# Patient Record
Sex: Male | Born: 1970 | Race: White | Hispanic: No | Marital: Single
Health system: Southern US, Community
[De-identification: ages and names within clinical notes are randomized; demographics above are authoritative.]

## PROBLEM LIST (undated history)

## (undated) DIAGNOSIS — K7290 Hepatic failure, unspecified without coma: Secondary | ICD-10-CM

---

## 2020-08-12 ENCOUNTER — Other Ambulatory Visit: Payer: Self-pay

## 2020-08-12 ENCOUNTER — Emergency Department (HOSPITAL_COMMUNITY)
Admission: EM | Admit: 2020-08-12 | Discharge: 2020-08-12 | Disposition: A | Discharge status: Home / Self Care | Payer: BC Managed Care – PPO | Attending: Emergency Medicine | Admitting: Emergency Medicine

## 2020-08-12 ENCOUNTER — Emergency Department (HOSPITAL_COMMUNITY): Payer: BC Managed Care – PPO

## 2020-08-12 DIAGNOSIS — R0602 Shortness of breath: Secondary | ICD-10-CM | POA: Insufficient documentation

## 2020-08-12 DIAGNOSIS — D649 Anemia, unspecified: Secondary | ICD-10-CM | POA: Insufficient documentation

## 2020-08-12 DIAGNOSIS — Z9889 Other specified postprocedural states: Secondary | ICD-10-CM

## 2020-08-12 DIAGNOSIS — Z20822 Contact with and (suspected) exposure to covid-19: Secondary | ICD-10-CM | POA: Insufficient documentation

## 2020-08-12 DIAGNOSIS — K7031 Alcoholic cirrhosis of liver with ascites: Secondary | ICD-10-CM

## 2020-08-12 DIAGNOSIS — R1900 Intra-abdominal and pelvic swelling, mass and lump, unspecified site: Secondary | ICD-10-CM | POA: Insufficient documentation

## 2020-08-12 DIAGNOSIS — R7401 Elevation of levels of liver transaminase levels: Secondary | ICD-10-CM | POA: Insufficient documentation

## 2020-08-12 HISTORY — PX: IR PARACENTESIS: IMG2679

## 2020-08-12 LAB — CBC WITH DIFFERENTIAL/PLATELET
Abs Immature Granulocytes: 0.01 10*3/uL (ref 0.00–0.07)
Basophils Absolute: 0 10*3/uL (ref 0.0–0.1)
Basophils Relative: 1 %
Eosinophils Absolute: 0.1 10*3/uL (ref 0.0–0.5)
Eosinophils Relative: 2 %
HCT: 35.3 % — ABNORMAL LOW (ref 39.0–52.0)
Hemoglobin: 11.7 g/dL — ABNORMAL LOW (ref 13.0–17.0)
Immature Granulocytes: 0 %
Lymphocytes Relative: 10 %
Lymphs Abs: 0.6 10*3/uL — ABNORMAL LOW (ref 0.7–4.0)
MCH: 39.7 pg — ABNORMAL HIGH (ref 26.0–34.0)
MCHC: 33.1 g/dL (ref 30.0–36.0)
MCV: 119.7 fL — ABNORMAL HIGH (ref 80.0–100.0)
Monocytes Absolute: 0.4 10*3/uL (ref 0.1–1.0)
Monocytes Relative: 6 %
Neutro Abs: 5.4 10*3/uL (ref 1.7–7.7)
Neutrophils Relative %: 81 %
Platelets: 122 10*3/uL — ABNORMAL LOW (ref 150–400)
RBC: 2.95 MIL/uL — ABNORMAL LOW (ref 4.22–5.81)
RDW: 14.6 % (ref 11.5–15.5)
WBC: 6.6 10*3/uL (ref 4.0–10.5)
nRBC: 0 % (ref 0.0–0.2)

## 2020-08-12 LAB — URINALYSIS, ROUTINE W REFLEX MICROSCOPIC
Bacteria, UA: NONE SEEN
Bilirubin Urine: NEGATIVE
Glucose, UA: NEGATIVE mg/dL
Ketones, ur: NEGATIVE mg/dL
Leukocytes,Ua: NEGATIVE
Nitrite: NEGATIVE
Protein, ur: NEGATIVE mg/dL
Specific Gravity, Urine: 1.011 (ref 1.005–1.030)
pH: 6 (ref 5.0–8.0)

## 2020-08-12 LAB — COMPREHENSIVE METABOLIC PANEL
ALT: 114 U/L — ABNORMAL HIGH (ref 0–44)
AST: 103 U/L — ABNORMAL HIGH (ref 15–41)
Albumin: 2.5 g/dL — ABNORMAL LOW (ref 3.5–5.0)
Alkaline Phosphatase: 166 U/L — ABNORMAL HIGH (ref 38–126)
Anion gap: 7 (ref 5–15)
BUN: 11 mg/dL (ref 6–20)
CO2: 23 mmol/L (ref 22–32)
Calcium: 8.8 mg/dL — ABNORMAL LOW (ref 8.9–10.3)
Chloride: 107 mmol/L (ref 98–111)
Creatinine, Ser: 1.04 mg/dL (ref 0.61–1.24)
GFR, Estimated: >60 mL/min (ref >60)
Glucose, Bld: 92 mg/dL (ref 70–99)
Potassium: 3.5 mmol/L (ref 3.5–5.1)
Sodium: 137 mmol/L (ref 135–145)
Total Bilirubin: 6.9 mg/dL — ABNORMAL HIGH (ref 0.3–1.2)
Total Protein: 5.4 g/dL — ABNORMAL LOW (ref 6.5–8.1)

## 2020-08-12 LAB — RESP PANEL BY RT-PCR (FLU A&B, COVID) ARPGX2
Influenza A by PCR: NEGATIVE
Influenza B by PCR: NEGATIVE
SARS Coronavirus 2 by RT PCR: NEGATIVE

## 2020-08-12 LAB — LIPASE, BLOOD: Lipase: 41 U/L (ref 11–51)

## 2020-08-12 MED ORDER — LIDOCAINE HCL 1 % IJ SOLN
INTRAMUSCULAR | Status: AC
  Filled 2020-08-12: qty 20

## 2020-08-12 NOTE — ED Notes (Signed)
Pt to IR for paracentesis. 

## 2020-08-12 NOTE — Discharge Instructions (Addendum)
Contact a health care provider if: You have redness, swelling, or pain at your puncture site. You have more fluid or blood coming from your puncture site. Your puncture site feels warm to the touch. You have pus or a bad smell coming from your puncture site. You have a fever. Get help right away if: You have chest pain or shortness of breath. You develop increasing pain, discomfort, or swelling in your abdomen. You feel dizzy or light-headed, or you faint.

## 2020-08-12 NOTE — ED Provider Notes (Signed)
[facility] EMERGENCY DEPARTMENT Provider Note   CSN: [id number] Arrival date & time: 08/12/20  1122     History No chief complaint on file.   Justin Rogers is a 50 y.o. male who presents with abdominal swelling. He has a PMH of alcoholic cirrhosis. He is currently a patient at [facility] x 1 week.  He reports he has had worsening abdominal distention, fluid in his lower extremities and shortness of breath due to his abdominal distention.  He has needed a paracentesis in the past and states that he had when he thinks back in February at [place].  He denies fevers or chills.  He denies abdominal tenderness.  He states that the attending physician, Dr. Lysbeth Penner, at [facility] tried several times to obtain an outpatient order for paracentesis but was unable to obtain the procedure.   HPI     No past medical history on file.  There are no problems to display for this patient.        No family history on file.     Home Medications Prior to Admission medications   Not on File    Allergies    Patient has no allergy information on record.  Review of Systems   Review of Systems Ten systems reviewed and are negative for acute change, except as noted in the HPI.   Physical Exam Updated Vital Signs BP (!) 133/101   Pulse 79   Temp 98.3 F (36.8 C) (Oral)   Resp (!) 22   Ht 5\' 10"  (1.778 m)   Wt 101.8 kg   SpO2 100%   BMI 32.20 kg/m   Physical Exam Vitals and nursing note reviewed.  Constitutional:      General: He is not in acute distress.    Appearance: He is well-developed. He is not diaphoretic.  HENT:     Head: Normocephalic and atraumatic.  Eyes:     General: Scleral icterus present.     Conjunctiva/sclera: Conjunctivae normal.  Cardiovascular:     Rate and Rhythm: Normal rate and regular rhythm.     Heart sounds: Normal heart sounds.  Pulmonary:     Effort: Tachypnea present. No respiratory distress.     Breath sounds: Normal  breath sounds.  Abdominal:     General: There is distension.     Palpations: Abdomen is rigid. There is fluid wave.     Tenderness: There is no abdominal tenderness.  Musculoskeletal:     Cervical back: Normal range of motion and neck supple.  Skin:    General: Skin is warm and dry.  Neurological:     Mental Status: He is alert.  Psychiatric:        Behavior: Behavior normal.     ED Results / Procedures / Treatments   Labs (all labs ordered are listed, but only abnormal results are displayed) Labs Reviewed  CBC WITH DIFFERENTIAL/PLATELET - Abnormal; Notable for the following components:      Result Value   RBC 2.95 (*)    Hemoglobin 11.7 (*)    HCT 35.3 (*)    MCV 119.7 (*)    MCH 39.7 (*)    Platelets 122 (*)    Lymphs Abs 0.6 (*)    All other components within normal limits  COMPREHENSIVE METABOLIC PANEL - Abnormal; Notable for the following components:   Calcium 8.8 (*)    Total Protein 5.4 (*)    Albumin 2.5 (*)    AST 103 (*)  ALT 114 (*)    Alkaline Phosphatase 166 (*)    Total Bilirubin 6.9 (*)    All other components within normal limits  URINALYSIS, ROUTINE W REFLEX MICROSCOPIC - Abnormal; Notable for the following components:   Color, Urine AMBER (*)    Hgb urine dipstick SMALL (*)    All other components within normal limits  RESP PANEL BY RT-PCR (FLU A&B, COVID) [id number]  LIPASE, BLOOD    EKG EKG Interpretation  Date/Time:  Monday August 12 2020 11:22:58 EDT Ventricular Rate:  76 PR Interval:    QRS Duration: 102 QT Interval:  417 QTC Calculation: 469 R Axis:   68 Text Interpretation: Sinus rhythm Low voltage, precordial leads Confirmed by Virgina Norfolk [phone]) on 08/12/2020 11:37:06 AM   Radiology DG Chest Portable 1 View  Result Date: 08/12/2020 CLINICAL DATA:  Shortness of breath EXAM: PORTABLE CHEST 1 VIEW COMPARISON:  None. FINDINGS: There is mild atelectasis in the left base. The lungs elsewhere are clear. Heart is upper normal in size  with pulmonary vascularity normal. No adenopathy. No bone lesions. There is degenerative change in each shoulder. There are surgical clips in the medial left supraclavicular region. IMPRESSION: Left base atelectasis. Lungs otherwise clear. Heart upper normal in size. Electronically Signed   By: Bretta Bang III M.D.   On: 08/12/2020 11:56    Procedures Procedures ]  Medications Ordered in ED Medications - No data to display  ED Course  I have reviewed the triage vital signs and the nursing notes.  Pertinent labs & imaging results that were available during my care of the patient were reviewed by me and considered in my medical decision making (see chart for details).  Clinical Course as of 08/14/20 1239  Mon Aug 12, 2020  1348 Albumin(!): 2.5 [AH]    Clinical Course User Index [AH] Arthor Captain, PA-C   MDM Rules/Calculators/A&P                          Patient here with ascites Labs ordered/ reviewed - cbc with macrocytic anemia, CMP with elevated liver enzymes, bilirubin of almost 7, albumin of 2.5 are consistent with history of alcoholic cirrhosis.  Covid panel is negative.  UA without evidence of infection. I discussed the case with Dr. Bryn Gulling of IR patient will have paracentesis today.  Patient has returned.  4 L removed from the abdomen.  Patient feels significantly improved.  I gave report to nursing staff at [facility] and patient will be picked up by their facility.  He appears appropriate for discharge at this time. Final Clinical Impression(s) / ED Diagnoses Final diagnoses:  Abdominal swelling    Rx / DC Orders ED Discharge Orders    None       Arthor Captain, PA-C 08/14/20 1243    Virgina Norfolk, DO 08/15/20 [phone]

## 2020-08-12 NOTE — ED Triage Notes (Signed)
PT BIB [facility] from [facility] for abd pain and swelling r/t ETOH and liver failure.  Pt had almost 2L pulled off in paracentesis at [facility] in February.  Also complaining of some swelling to feet and ankles.

## 2020-08-14 NOTE — Procedures (Signed)
PROCEDURE SUMMARY:  Successful US guided paracentesis from left lateral abdomen.  Yielded 4.0 liters of yellow fluid.  No immediate complications.  Pt tolerated well.   Specimen was not sent for labs.  EBL < 46mL  Hoyt Koch PA-C 08/14/2020 9:31 AM

## 2020-08-20 ENCOUNTER — Emergency Department (HOSPITAL_COMMUNITY)
Admission: EM | Admit: 2020-08-20 | Discharge: 2020-08-20 | Disposition: A | Discharge status: Home / Self Care | Payer: BC Managed Care – PPO | Attending: Emergency Medicine | Admitting: Emergency Medicine

## 2020-08-20 ENCOUNTER — Encounter (HOSPITAL_COMMUNITY): Payer: Self-pay | Admitting: Emergency Medicine

## 2020-08-20 ENCOUNTER — Emergency Department (HOSPITAL_COMMUNITY): Payer: BC Managed Care – PPO

## 2020-08-20 ENCOUNTER — Emergency Department (HOSPITAL_BASED_OUTPATIENT_CLINIC_OR_DEPARTMENT_OTHER): Payer: BC Managed Care – PPO

## 2020-08-20 DIAGNOSIS — L03116 Cellulitis of left lower limb: Secondary | ICD-10-CM | POA: Diagnosis not present

## 2020-08-20 DIAGNOSIS — R609 Edema, unspecified: Secondary | ICD-10-CM

## 2020-08-20 DIAGNOSIS — M25572 Pain in left ankle and joints of left foot: Secondary | ICD-10-CM | POA: Diagnosis present

## 2020-08-20 HISTORY — DX: Hepatic failure, unspecified without coma: K72.90

## 2020-08-20 MED ORDER — CEPHALEXIN 500 MG PO CAPS: 500.0000 mg | ORAL_CAPSULE | Freq: Four times a day (QID) | ORAL | 0 refills | Status: AC

## 2020-08-20 MED ORDER — CEPHALEXIN 500 MG PO CAPS: 500.0000 mg | ORAL_CAPSULE | Freq: Four times a day (QID) | ORAL | 0 refills | Status: DC

## 2020-08-20 MED ORDER — OXYCODONE-ACETAMINOPHEN 5-325 MG PO TABS
1.0000 | ORAL_TABLET | Freq: Once | ORAL | Status: DC
  Filled 2020-08-20: qty 1

## 2020-08-20 NOTE — Discharge Instructions (Signed)
You came to the emerge department to be evaluated for your left leg swelling.  Your x-ray showed no broken bones or dislocations.  Your ultrasound showed no DVT.  Based on your physical exam there is a concern that you have a cellulitis skin infection.  Because of this I have given you prescription for the antibiotic Keflex.  Please take 1 pill 4 times a day for the next 7 days.  Please elevate your leg as much as possible to help decrease swelling.  Please use ice 20 minutes, then give break of 20 minutes before reapplying to help decrease swelling.  If your symptoms do not improve over the next 7 days please follow-up with your primary care provider.  Get help right away if: Your symptoms get worse. You feel very sleepy. You develop vomiting or diarrhea that persists. You notice red streaks coming from the infected area. Your red area gets larger or turns dark in color.

## 2020-08-20 NOTE — ED Triage Notes (Signed)
Per EMS-from [facility]-complaining of LLE swelling

## 2020-08-20 NOTE — ED Provider Notes (Signed)
[facility]-EMERGENCY DEPT Provider Note   CSN: [id number] Arrival date & time: 08/20/20  1216     History Chief Complaint  Patient presents with  . Leg Pain    Justin Rogers is a 50 y.o. male with history of liver failure.  Patient presents to the emergency department with chief complaint of left ankle pain and swelling.  Patient denies any recent injuries or trauma.  Patient reports that his pain and swelling began this morning approximately 0300.  Pain is located to medial and lateral malleolus.  Reports pain is constant.  Patient rates pain 10/10 on the pain scale.  Denies any radiation of pain.  Pain is improved with rest and worse with movement.  Patient endorses erythema to left ankle.  Patient denies any previous history of similar symptoms.  Patient denies any fever, chills, numbness or tingling to extremities, weakness of extremities, wounds, compromised immune system, chest pain, shortness of breath.  HPI     Past Medical History:  Diagnosis Date  . Liver failure (HCC)     There are no problems to display for this patient.   Past Surgical History:  Procedure Laterality Date  . IR PARACENTESIS  08/12/2020       No family history on file.  Social History   Substance Use Topics  . Alcohol use: Yes    Home Medications Prior to Admission medications   Not on File    Allergies    Patient has no allergy information on record.  Review of Systems   Review of Systems  Constitutional: Negative for chills and fever.  Respiratory: Negative for shortness of breath.   Cardiovascular: Positive for leg swelling. Negative for chest pain.  Musculoskeletal: Positive for arthralgias and joint swelling. Negative for back pain and neck pain.  Skin: Positive for color change. Negative for pallor, rash and wound.  Allergic/Immunologic: Negative for immunocompromised state.  Neurological: Negative for weakness and numbness.    Physical  Exam Updated Vital Signs BP (!) 143/113 (BP Location: Right Arm)   Pulse 90   Temp 98.7 F (37.1 C) (Oral)   Resp 16   SpO2 100%   Physical Exam Vitals and nursing note reviewed.  Constitutional:      General: He is not in acute distress.    Appearance: He is not ill-appearing, toxic-appearing or diaphoretic.  HENT:     Head: Normocephalic.  Eyes:     General: No scleral icterus.       Right eye: No discharge.        Left eye: No discharge.  Cardiovascular:     Rate and Rhythm: Normal rate.  Pulmonary:     Effort: Pulmonary effort is normal.  Musculoskeletal:     Right upper leg: Normal.     Left upper leg: Normal.     Right knee: No swelling, deformity, effusion, erythema, ecchymosis, lacerations or bony tenderness. Normal range of motion. No tenderness. Normal alignment.     Left knee: No swelling, deformity, effusion, erythema, ecchymosis, lacerations or bony tenderness. Normal range of motion. No tenderness. Normal alignment.     Right lower leg: Normal.     Left lower leg: No deformity, lacerations, tenderness or bony tenderness. 1+ Edema present.     Right ankle: No swelling, deformity, ecchymosis or lacerations. No tenderness. Normal range of motion.     Left ankle: Swelling present. No deformity, ecchymosis or lacerations. Tenderness present over the lateral malleolus and medial malleolus. Normal range of motion.  Left foot: Normal range of motion and normal capillary refill. No swelling, deformity, bunion, laceration, tenderness or bony tenderness. Normal pulse.     Comments: Erythema noted to left ankle wound left lower leg; no rash, purpura, petechia noted to affected limb.  Skin:    General: Skin is warm and dry.  Neurological:     General: No focal deficit present.     Mental Status: He is alert.     Comments: Patient able to stand and ambulate  Psychiatric:        Behavior: Behavior is cooperative.     ED Results / Procedures / Treatments   Labs (all  labs ordered are listed, but only abnormal results are displayed) Labs Reviewed - No data to display  EKG None  Radiology DG Ankle Complete Left  Result Date: 08/20/2020 CLINICAL DATA:  Edema.  Pain and swelling to the left ankle. EXAM: LEFT ANKLE COMPLETE - 3+ VIEW COMPARISON:  None FINDINGS: There is diffuse soft tissue swelling. No underlying fracture or dislocation. Plantar heel spur. IMPRESSION: 1. Diffuse soft tissue swelling. 2. No acute osseous findings. Electronically Signed   By: Signa Kell M.D.   On: 08/20/2020 14:26   VAS [place] LOWER EXTREMITY VENOUS (DVT) ([facility] and [facility] 7a-7p)  Result Date: 08/20/2020  Lower Venous DVT Study Indications: Edema.  Comparison Study: no prior Performing Technologist: Blanch Media RVS  Examination Guidelines: A complete evaluation includes B-mode imaging, spectral Doppler, color Doppler, and power Doppler as needed of all accessible portions of each vessel. Bilateral testing is considered an integral part of a complete examination. Limited examinations for reoccurring indications may be performed as noted. The reflux portion of the exam is performed with the patient in reverse Trendelenburg.  +-----+---------------+---------+-----------+----------+--------------+ RIGHTCompressibilityPhasicitySpontaneityPropertiesThrombus Aging +-----+---------------+---------+-----------+----------+--------------+ CFV  Full           Yes      Yes                                 +-----+---------------+---------+-----------+----------+--------------+   +---------+---------------+---------+-----------+----------+--------------+ LEFT     CompressibilityPhasicitySpontaneityPropertiesThrombus Aging +---------+---------------+---------+-----------+----------+--------------+ CFV      Full           Yes      Yes                                 +---------+---------------+---------+-----------+----------+--------------+ SFJ      Full                                                         +---------+---------------+---------+-----------+----------+--------------+ FV Prox  Full                                                        +---------+---------------+---------+-----------+----------+--------------+ FV Mid   Full                                                        +---------+---------------+---------+-----------+----------+--------------+  FV DistalFull                                                        +---------+---------------+---------+-----------+----------+--------------+ PFV      Full                                                        +---------+---------------+---------+-----------+----------+--------------+ POP      Full           Yes      Yes                                 +---------+---------------+---------+-----------+----------+--------------+ PTV      Full                                                        +---------+---------------+---------+-----------+----------+--------------+ PERO     Full                                                        +---------+---------------+---------+-----------+----------+--------------+     Summary: RIGHT: - No evidence of common femoral vein obstruction.  LEFT: - There is no evidence of deep vein thrombosis in the lower extremity.  - No cystic structure found in the popliteal fossa.  *See table(s) above for measurements and observations.    Preliminary     Procedures Procedures   Medications Ordered in ED Medications - No data to display  ED Course  I have reviewed the triage vital signs and the nursing notes.  Pertinent labs & imaging results that were available during my care of the patient were reviewed by me and considered in my medical decision making (see chart for details).    MDM Rules/Calculators/A&P                          Alert 50 year old male no acute distress, nontoxic-appearing.  Patient presents with chief complaint  of left ankle pain and swelling.  Patient denies any injuries or trauma.  On physical exam patient is noted to have +1 edema to left lower leg and swelling to left ankle.  Patient has erythema to left ankle and lower leg.  No rash, purpura, petechia noted to affected limb.  Patient has pulse, motor, and sensation intact to left foot.  Patient able to stand and ambulate.  X-ray imaging study obtained while patient was in waiting room.  X-ray imaging of left ankle shows diffuse soft tissue swelling.  No acute osseous findings. Ultrasound of left lower extremity shows no evidence of DVT, no cystic structure found in the popliteal fossa.  Due to patient's erythema turn for cellulitic infection.  Will start patient on 7-day course of Keflex.  Patient vies to use over-the-counter  pain medication as needed.  Patient advised to elevate and ice his leg to reduce swelling.  Patient to follow-up with his primary care provider for reassessment.  Discussed results, findings, treatment and follow up. Patient advised of return precautions. Patient verbalized understanding and agreed with plan.   Final Clinical Impression(s) / ED Diagnoses Final diagnoses:  Cellulitis of left lower extremity    Rx / DC Orders ED Discharge Orders         Ordered    cephALEXin (KEFLEX) 500 MG capsule  4 times daily,   Status:  Discontinued        08/20/20 1516    cephALEXin (KEFLEX) 500 MG capsule  4 times daily        08/20/20 1613           Berneice Heinrich 08/21/20 0218    Tilden Fossa, MD 08/24/20 1642

## 2020-08-20 NOTE — ED Triage Notes (Signed)
Emergency Medicine Provider Triage Evaluation Note  Justin Rogers , a 50 y.o. male  was evaluated in triage.  Pt complains of pain in left ankle.  Atraumatic.  Woke up with the pain and swelling.   [facility] is [facility] and sent here for dvt rule out and full evaluation.   Physical Exam  BP (!) 143/113 (BP Location: Right Arm)   Pulse 90   Temp 98.7 F (37.1 C) (Oral)   Resp 16   SpO2 100%   Obvious edema of left lower extremity with mild erythema.  Asymmetric edema.    Medical Decision Making  Medically screening exam initiated at 1:08 PM.  Appropriate orders placed.  Riaz Onorato was informed that the remainder of the evaluation will be completed by another provider, this initial triage assessment does not replace that evaluation, and the importance of remaining in the ED until their evaluation is complete.     Cristina Gong, [place] 08/20/20 1311

## 2020-08-20 NOTE — Progress Notes (Signed)
Lower extremity venous has been completed.   Preliminary results in CV Proc.   Blanch Media 08/20/2020 2:14 PM

## 2021-12-15 IMAGING — US IR PARACENTESIS
1 series · 3 of 3 positions shown · non-contrast
Comparison: none

INDICATION: Patient with history of alcoholic hepatitis, recurrent ascites.
Request is made for therapeutic paracentesis.

[Series 1: ir (id) (id)/(id)/(id) ir · 3 of 3 slices shown]
[im 1/3]
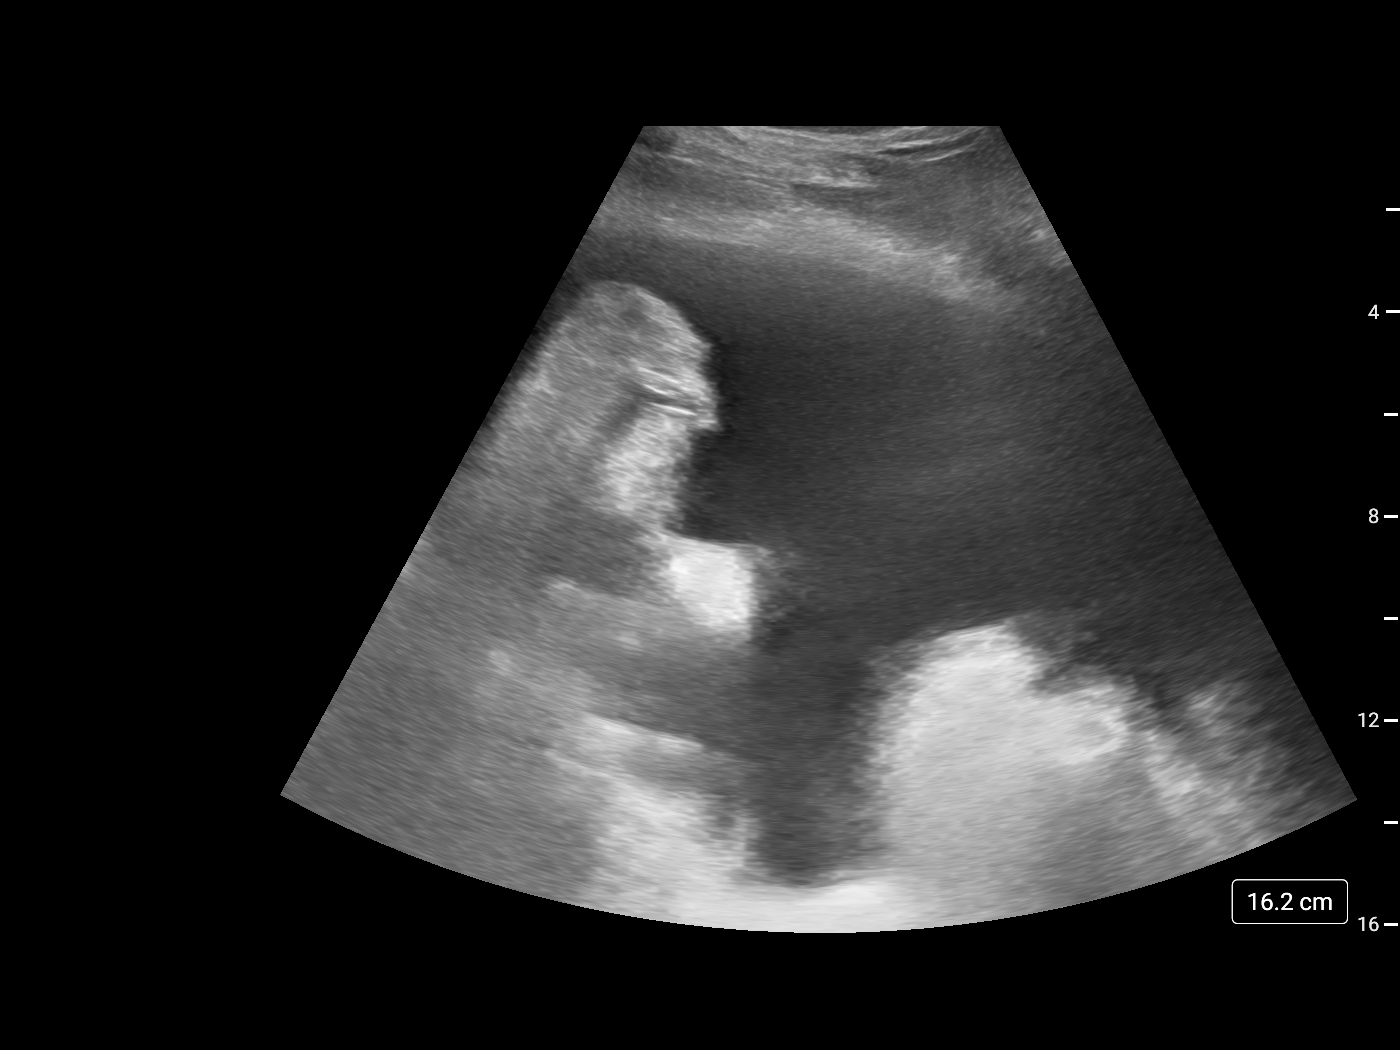
[im 2/3]
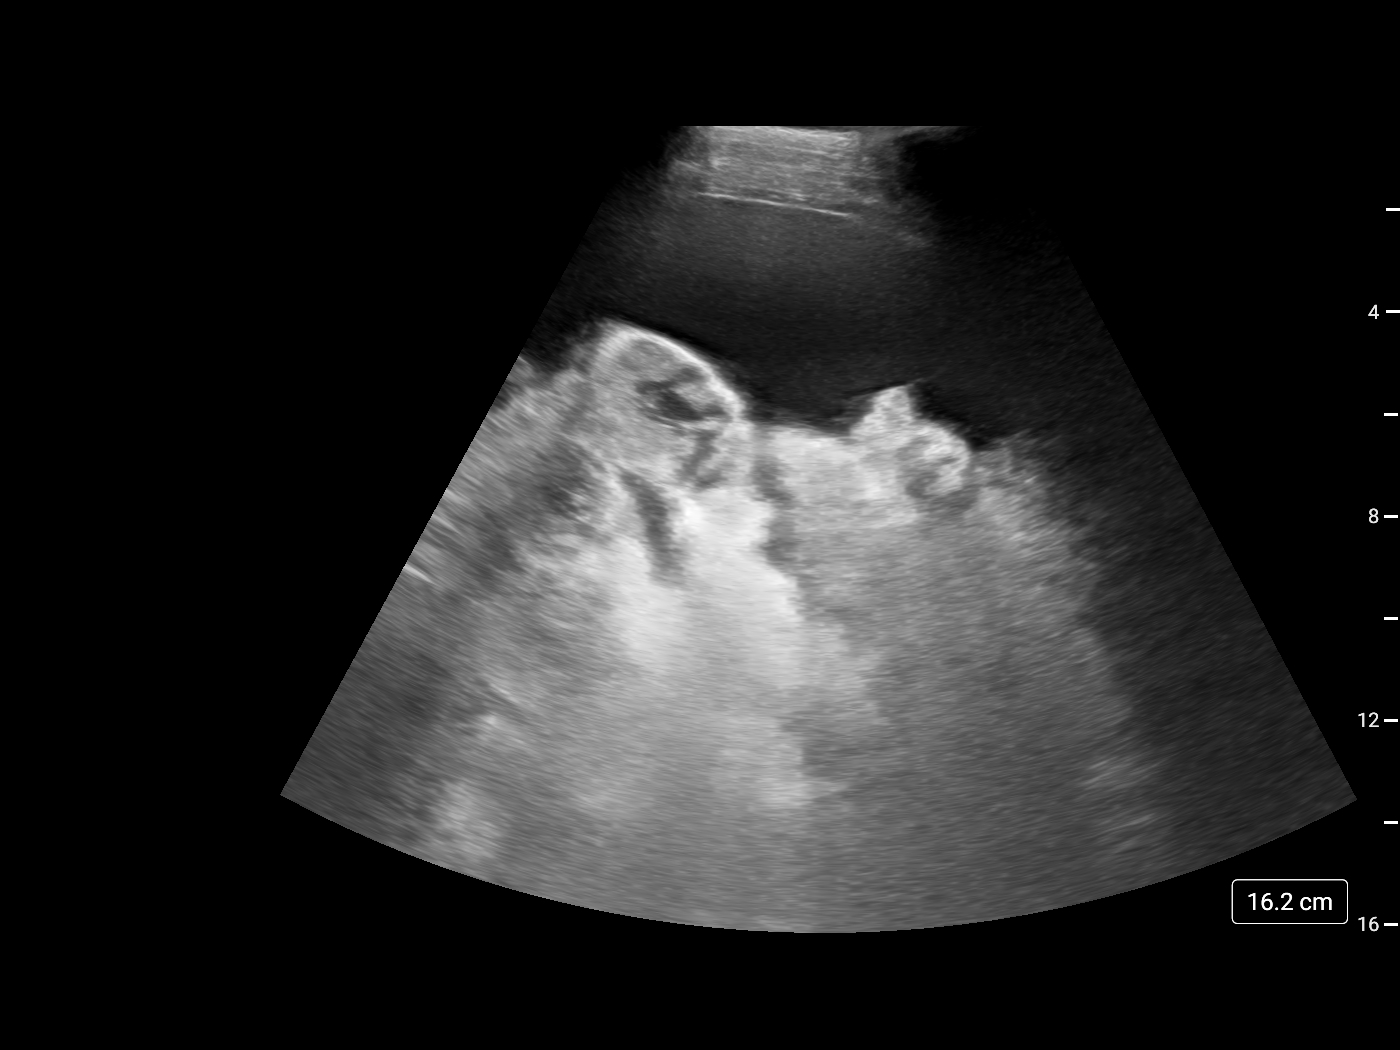
[im 3/3]
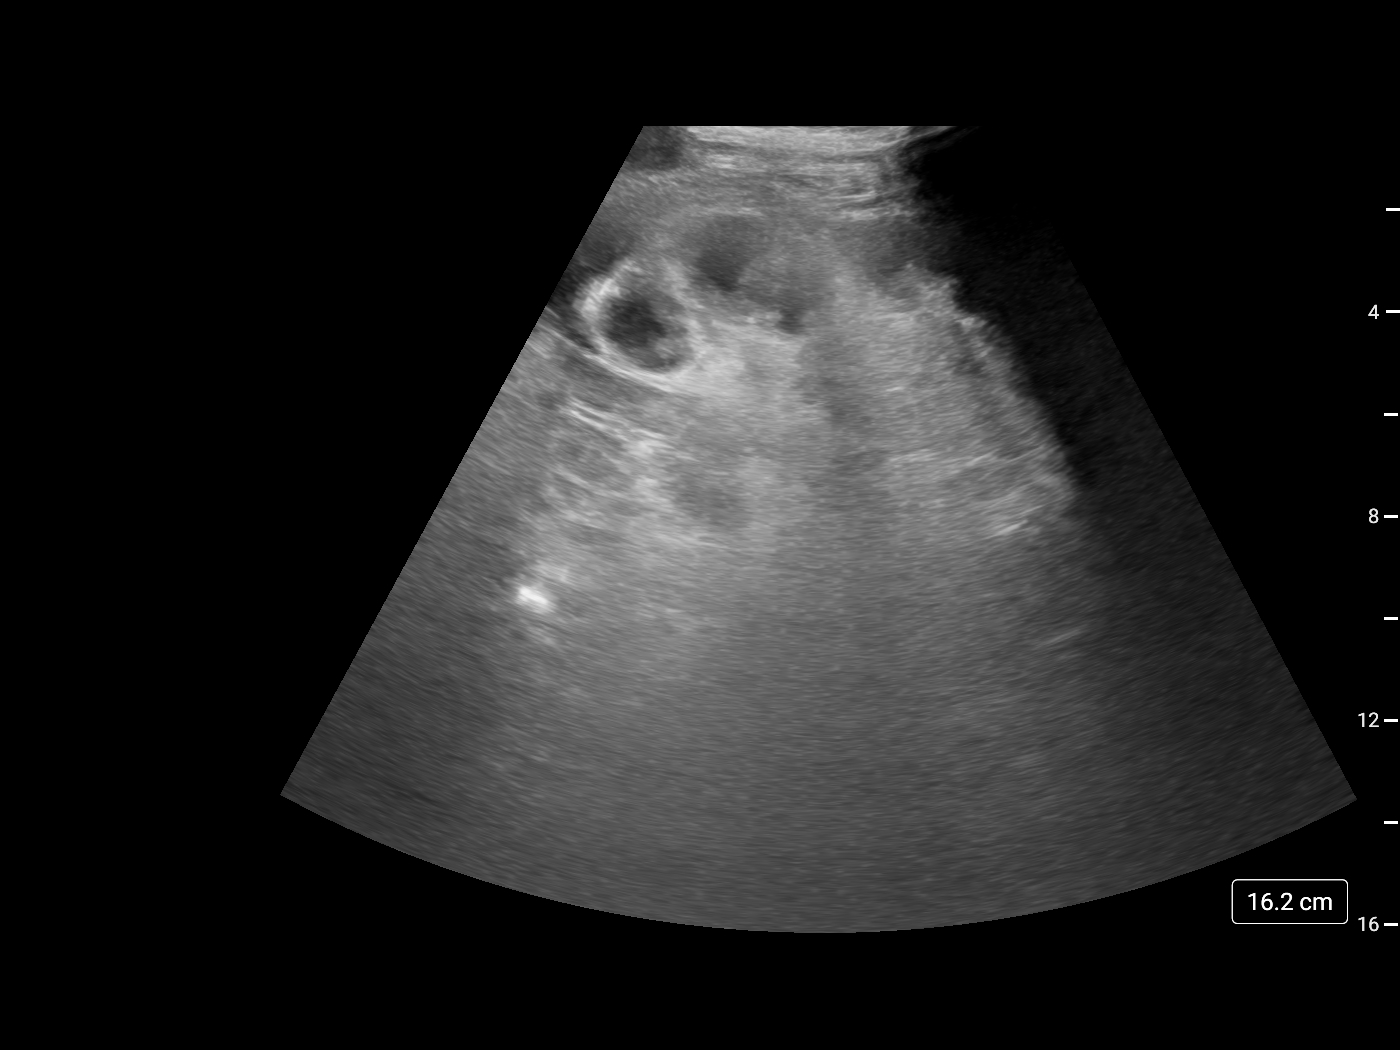

[3 of 3 positions shown; findings below may reference images not displayed]

EXAM:
ULTRASOUND GUIDED THERAPEUTIC PARACENTESIS

MEDICATIONS:
10 mL 1% lidocaine

COMPLICATIONS:
None immediate.

PROCEDURE:
Informed written consent was obtained from the patient after a
discussion of the risks, benefits and alternatives to treatment. A
timeout was performed prior to the initiation of the procedure.

Initial ultrasound scanning demonstrates a large amount of ascites
within the left lateral abdomen. The left lateral abdomen was
prepped and draped in the usual sterile fashion. 1% lidocaine was
used for local anesthesia.

Following this, a 19 gauge, 7-cm, Yueh catheter was introduced. An
ultrasound image was saved for documentation purposes. The
paracentesis was performed. The catheter was removed and a dressing
was applied. The patient tolerated the procedure well without
immediate post procedural complication.
FINDINGS: A total of approximately 4.0 liters of yellow fluid was removed.
IMPRESSION: Successful ultrasound-guided therapeutic paracentesis yielding
liters of peritoneal fluid.
